# Patient Record
Sex: Male | Born: 1990 | Race: Black or African American | Hispanic: No | Marital: Single | Smoking: Current some day smoker
Health system: Southern US, Community
[De-identification: ages and names within clinical notes are randomized; demographics above are authoritative.]

## PROBLEM LIST (undated history)

## (undated) DIAGNOSIS — K219 Gastro-esophageal reflux disease without esophagitis: Secondary | ICD-10-CM

## (undated) DIAGNOSIS — M545 Low back pain: Secondary | ICD-10-CM

## (undated) DIAGNOSIS — Z7251 High risk heterosexual behavior: Principal | ICD-10-CM

## (undated) DIAGNOSIS — G8929 Other chronic pain: Secondary | ICD-10-CM

## (undated) HISTORY — DX: High risk heterosexual behavior: Z72.51

## (undated) HISTORY — DX: Low back pain: M54.5

## (undated) HISTORY — DX: Other chronic pain: G89.29

---
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
## 2012-07-04 ENCOUNTER — Encounter (HOSPITAL_COMMUNITY): Payer: Self-pay

## 2012-07-04 ENCOUNTER — Emergency Department (HOSPITAL_COMMUNITY)
Admission: EM | Admit: 2012-07-04 | Discharge: 2012-07-04 | Disposition: A | Discharge status: Home / Self Care | Payer: BC Managed Care – PPO | Attending: Emergency Medicine | Admitting: Emergency Medicine

## 2012-07-04 ENCOUNTER — Emergency Department (HOSPITAL_COMMUNITY): Payer: BC Managed Care – PPO

## 2012-07-04 DIAGNOSIS — K219 Gastro-esophageal reflux disease without esophagitis: Secondary | ICD-10-CM | POA: Insufficient documentation

## 2012-07-04 DIAGNOSIS — Y9389 Activity, other specified: Secondary | ICD-10-CM | POA: Insufficient documentation

## 2012-07-04 DIAGNOSIS — S0190XA Unspecified open wound of unspecified part of head, initial encounter: Secondary | ICD-10-CM | POA: Insufficient documentation

## 2012-07-04 DIAGNOSIS — S022XXB Fracture of nasal bones, initial encounter for open fracture: Secondary | ICD-10-CM | POA: Insufficient documentation

## 2012-07-04 DIAGNOSIS — S060XAA Concussion with loss of consciousness status unknown, initial encounter: Secondary | ICD-10-CM | POA: Insufficient documentation

## 2012-07-04 DIAGNOSIS — S060X9A Concussion with loss of consciousness of unspecified duration, initial encounter: Secondary | ICD-10-CM

## 2012-07-04 DIAGNOSIS — S022XXA Fracture of nasal bones, initial encounter for closed fracture: Secondary | ICD-10-CM

## 2012-07-04 DIAGNOSIS — F17200 Nicotine dependence, unspecified, uncomplicated: Secondary | ICD-10-CM | POA: Insufficient documentation

## 2012-07-04 DIAGNOSIS — Y9289 Other specified places as the place of occurrence of the external cause: Secondary | ICD-10-CM | POA: Insufficient documentation

## 2012-07-04 DIAGNOSIS — IMO0002 Reserved for concepts with insufficient information to code with codable children: Secondary | ICD-10-CM

## 2012-07-04 DIAGNOSIS — Z79899 Other long term (current) drug therapy: Secondary | ICD-10-CM | POA: Insufficient documentation

## 2012-07-04 HISTORY — DX: Gastro-esophageal reflux disease without esophagitis: K21.9

## 2012-07-04 MED ORDER — MORPHINE SULFATE 4 MG/ML IJ SOLN
4.0000 mg | Freq: Once | INTRAMUSCULAR | Status: AC
  Administered 2012-07-04: 4 mg via INTRAVENOUS
  Filled 2012-07-04: qty 1

## 2012-07-04 MED ORDER — OXYCODONE-ACETAMINOPHEN 5-325 MG PO TABS: ORAL_TABLET | ORAL | Status: DC

## 2012-07-04 MED ORDER — ONDANSETRON HCL 4 MG/2ML IJ SOLN
4.0000 mg | Freq: Once | INTRAMUSCULAR | Status: AC
  Administered 2012-07-04: 4 mg via INTRAVENOUS
  Filled 2012-07-04: qty 2
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
## 2012-07-04 NOTE — ED Notes (Signed)
Pt ambulatory leaving ED with mom. Pt given d/c instructions and prescriptions. Pt given work note per pt request. Pt and parent have no further questions upon d/c. Pt does not appear to be in any acute distress.
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
## 2012-07-04 NOTE — ED Provider Notes (Signed)
Medical screening examination/treatment/procedure(s) were performed by non-physician practitioner and as supervising physician I was immediately available for consultation/collaboration.  Juliet Rude. Rubin Payor, MD 07/04/12 [phone]

## 2012-07-31 ENCOUNTER — Ambulatory Visit: Payer: BC Managed Care – PPO | Admitting: Family Medicine

## 2012-12-18 ENCOUNTER — Other Ambulatory Visit: Payer: Self-pay

## 2012-12-18 DIAGNOSIS — Z9109 Other allergy status, other than to drugs and biological substances: Secondary | ICD-10-CM

## 2012-12-18 NOTE — Telephone Encounter (Signed)
Refill request for for Zyrtec10 mg. Lotrisone cream, Patanol 0.1mg and Ranitidine 150 mg

## 2012-12-24 MED ORDER — CETIRIZINE HCL 10 MG PO TABS: 10.0000 mg | ORAL_TABLET | Freq: Every day | ORAL | Status: DC | PRN

## 2012-12-24 MED ORDER — CLOTRIMAZOLE-BETAMETHASONE 1-0.05 % EX CREA: TOPICAL_CREAM | Freq: Two times a day (BID) | CUTANEOUS | Status: DC

## 2012-12-24 MED ORDER — OLOPATADINE HCL 0.1 % OP SOLN: 1.0000 [drp] | Freq: Two times a day (BID) | OPHTHALMIC | Status: DC

## 2012-12-24 MED ORDER — RANITIDINE HCL 150 MG PO TABS: 150.0000 mg | ORAL_TABLET | Freq: Every day | ORAL | Status: DC

## 2013-04-17 ENCOUNTER — Ambulatory Visit (INDEPENDENT_AMBULATORY_CARE_PROVIDER_SITE_OTHER): Payer: BC Managed Care – PPO | Admitting: Pediatrics

## 2013-04-17 ENCOUNTER — Telehealth: Payer: Self-pay | Admitting: *Deleted

## 2013-04-17 ENCOUNTER — Encounter: Payer: Self-pay | Admitting: Pediatrics

## 2013-04-17 VITALS — BP 118/70 | HR 80 | Temp 98.0°F | Wt 243.0 lb

## 2013-04-17 DIAGNOSIS — Z7251 High risk heterosexual behavior: Secondary | ICD-10-CM

## 2013-04-17 DIAGNOSIS — M549 Dorsalgia, unspecified: Secondary | ICD-10-CM

## 2013-04-17 DIAGNOSIS — R300 Dysuria: Secondary | ICD-10-CM

## 2013-04-17 DIAGNOSIS — G8929 Other chronic pain: Secondary | ICD-10-CM

## 2013-04-17 DIAGNOSIS — M545 Low back pain, unspecified: Secondary | ICD-10-CM

## 2013-04-17 HISTORY — DX: Other chronic pain: G89.29

## 2013-04-17 HISTORY — DX: Low back pain, unspecified: M54.50

## 2013-04-17 HISTORY — DX: High risk heterosexual behavior: Z72.51

## 2013-04-17 LAB — POCT URINALYSIS DIPSTICK
Bilirubin, UA: NEGATIVE
Blood, UA: NEGATIVE
Glucose, UA: NEGATIVE
Ketones, UA: NEGATIVE
Leukocytes, UA: NEGATIVE
Nitrite, UA: NEGATIVE
Protein, UA: NEGATIVE
Spec Grav, UA: 1.02
Urobilinogen, UA: NEGATIVE
pH, UA: 6.5

## 2013-04-17 MED ORDER — AZITHROMYCIN 1 G PO PACK: 2.0000 | PACK | Freq: Once | ORAL | Status: DC

## 2013-04-17 MED ORDER — PENICILLIN G BENZATHINE 2400000 UNIT/4ML IM SUSP: 1.0000 | Freq: Once | INTRAMUSCULAR | Status: DC

## 2013-04-17 MED ORDER — CEFTRIAXONE SODIUM 1 G IJ SOLR
250.0000 mg | Freq: Once | INTRAMUSCULAR | Status: AC
  Administered 2013-04-17: 250 mg via INTRAMUSCULAR

## 2013-04-17 NOTE — Patient Instructions (Signed)
Sexually Transmitted Disease  Sexually transmitted disease (STD) refers to any infection that is passed from person to person during sexual activity. This may happen by way of saliva, semen, blood, vaginal mucus, or urine. Common STDs include:   Gonorrhea.   Chlamydia.   Syphilis.   HIV/AIDS.   Genital herpes.   Hepatitis B and C.   Trichomonas.   Human papillomavirus (HPV).   Pubic lice.  CAUSES   An STD may be spread by bacteria, virus, or parasite. A person can get an STD by:   Sexual intercourse with an infected person.   Sharing sex toys with an infected person.   Sharing needles with an infected person.   Having intimate contact with the genitals, mouth, or rectal areas of an infected person.  SYMPTOMS   Some people may not have any symptoms, but they can still pass the infection to others. Different STDs have different symptoms. Symptoms include:   Painful or bloody urination.   Pain in the pelvis, abdomen, vagina, anus, throat, or eyes.   Skin rash, itching, irritation, growths, or sores (lesions). These usually occur in the genital or anal area.   Abnormal vaginal discharge.   Penile discharge in men.   Soft, flesh-colored skin growths in the genital or anal area.   Fever.   Pain or bleeding during sexual intercourse.   Swollen glands in the groin area.   Yellow skin and eyes (jaundice). This is seen with hepatitis.  DIAGNOSIS   To make a diagnosis, your caregiver may:   Take a medical history.   Perform a physical exam.   Take a specimen (culture) to be examined.   Examine a sample of discharge under a microscope.   Perform blood tests.   Perform a Pap test, if this applies.   Perform a colposcopy.   Perform a laparoscopy.  TREATMENT    Chlamydia, gonorrhea, trichomonas, and syphilis can be cured with antibiotic medicine.   Genital herpes, hepatitis, and HIV can be treated, but not cured, with prescribed medicines. The medicines will lessen the symptoms.   Genital warts  from HPV can be treated with medicine or by freezing, burning (electrocautery), or surgery. Warts may come back.   HPV is a virus and cannot be cured with medicine or surgery.However, abnormal areas may be followed very closely by your caregiver and may be removed from the cervix, vagina, or vulva through office procedures or surgery.  If your diagnosis is confirmed, your recent sexual partners need treatment. This is true even if they are symptom-free or have a negative culture or evaluation. They should not have sex until their caregiver says it is okay.  HOME CARE INSTRUCTIONS   All sexual partners should be informed, tested, and treated for all STDs.   Take your antibiotics as directed. Finish them even if you start to feel better.   Only take over-the-counter or prescription medicines for pain, discomfort, or fever as directed by your caregiver.   Rest.   Eat a balanced diet and drink enough fluids to keep your urine clear or pale yellow.   Do not have sex until treatment is completed and you have followed up with your caregiver. STDs should be checked after treatment.   Keep all follow-up appointments, Pap tests, and blood tests as directed by your caregiver.   Only use latex condoms and water-soluble lubricants during sexual activity. Do not use petroleum jelly or oils.   Avoid alcohol and illegal drugs.   Get vaccinated   for HPV and hepatitis. If you have not received these vaccines in the past, talk to your caregiver about whether one or both might be right for you.   Avoid risky sex practices that can break the skin.  The only way to avoid getting an STD is to avoid all sexual activity.Latex condoms and dental dams (for oral sex) will help lessen the risk of getting an STD, but will not completely eliminate the risk.  SEEK MEDICAL CARE IF:    You have a fever.   You have any new or worsening symptoms.  Document Released: 11/12/2002 Document Revised: 11/14/2011 Document Reviewed:  11/19/2010  [employer] Patient Information 2014 [employer]
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
## 2013-04-17 NOTE — Telephone Encounter (Signed)
Called pt and informed him that MD was calling in another ABT and that he had to pick it up at pharmacy and bring to office to be administered. Explained to him that it was an injection and to bring to office in morning. Pt understanding and appreciative.

## 2013-04-18 LAB — GC/CHLAMYDIA PROBE AMP, URINE
Chlamydia, Swab/Urine, PCR: NEGATIVE
GC Probe Amp, Urine: NEGATIVE

## 2013-05-09 ENCOUNTER — Ambulatory Visit: Payer: BC Managed Care – PPO | Admitting: Neurology

## 2013-05-31 ENCOUNTER — Ambulatory Visit: Payer: BC Managed Care – PPO | Admitting: Neurology

## 2014-05-24 ENCOUNTER — Other Ambulatory Visit: Payer: Self-pay | Admitting: Pediatrics

## 2015-10-26 ENCOUNTER — Emergency Department (HOSPITAL_COMMUNITY): Payer: No Typology Code available for payment source

## 2015-10-26 ENCOUNTER — Encounter (HOSPITAL_COMMUNITY): Payer: Self-pay

## 2015-10-26 ENCOUNTER — Emergency Department (HOSPITAL_COMMUNITY)
Admission: EM | Admit: 2015-10-26 | Discharge: 2015-10-26 | Disposition: A | Discharge status: Home / Self Care | Payer: No Typology Code available for payment source | Attending: Emergency Medicine | Admitting: Emergency Medicine

## 2015-10-26 DIAGNOSIS — S0001XA Abrasion of scalp, initial encounter: Secondary | ICD-10-CM | POA: Insufficient documentation

## 2015-10-26 DIAGNOSIS — S0990XA Unspecified injury of head, initial encounter: Secondary | ICD-10-CM

## 2015-10-26 DIAGNOSIS — F17210 Nicotine dependence, cigarettes, uncomplicated: Secondary | ICD-10-CM | POA: Diagnosis not present

## 2015-10-26 DIAGNOSIS — S199XXA Unspecified injury of neck, initial encounter: Secondary | ICD-10-CM | POA: Diagnosis not present

## 2015-10-26 DIAGNOSIS — S39012A Strain of muscle, fascia and tendon of lower back, initial encounter: Secondary | ICD-10-CM | POA: Diagnosis not present

## 2015-10-26 DIAGNOSIS — Y9389 Activity, other specified: Secondary | ICD-10-CM | POA: Diagnosis not present

## 2015-10-26 DIAGNOSIS — K219 Gastro-esophageal reflux disease without esophagitis: Secondary | ICD-10-CM | POA: Diagnosis not present

## 2015-10-26 DIAGNOSIS — G8929 Other chronic pain: Secondary | ICD-10-CM | POA: Diagnosis not present

## 2015-10-26 DIAGNOSIS — Y998 Other external cause status: Secondary | ICD-10-CM | POA: Insufficient documentation

## 2015-10-26 DIAGNOSIS — Y92410 Unspecified street and highway as the place of occurrence of the external cause: Secondary | ICD-10-CM | POA: Diagnosis not present

## 2015-10-26 DIAGNOSIS — Z79899 Other long term (current) drug therapy: Secondary | ICD-10-CM | POA: Insufficient documentation

## 2015-10-26 DIAGNOSIS — S3992XA Unspecified injury of lower back, initial encounter: Secondary | ICD-10-CM | POA: Diagnosis present

## 2015-10-26 MED ORDER — TRAMADOL HCL 50 MG PO TABS: 50.0000 mg | ORAL_TABLET | Freq: Four times a day (QID) | ORAL | Status: DC | PRN

## 2015-10-26 MED ORDER — IBUPROFEN 600 MG PO TABS: 600.0000 mg | ORAL_TABLET | Freq: Four times a day (QID) | ORAL | Status: DC | PRN

## 2015-10-26 MED ORDER — IBUPROFEN 800 MG PO TABS
800.0000 mg | ORAL_TABLET | Freq: Once | ORAL | Status: AC
  Administered 2015-10-26: 800 mg via ORAL
  Filled 2015-10-26: qty 1
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
## 2019-03-18 ENCOUNTER — Other Ambulatory Visit: Payer: Self-pay

## 2019-03-18 DIAGNOSIS — Z20822 Contact with and (suspected) exposure to covid-19: Secondary | ICD-10-CM

## 2019-03-22 LAB — NOVEL CORONAVIRUS, NAA: SARS-CoV-2, NAA: NOT DETECTED

## 2019-09-17 ENCOUNTER — Ambulatory Visit: Payer: BLUE CROSS/BLUE SHIELD | Attending: Internal Medicine

## 2019-09-17 ENCOUNTER — Other Ambulatory Visit: Payer: Self-pay

## 2019-09-17 DIAGNOSIS — Z20822 Contact with and (suspected) exposure to covid-19: Secondary | ICD-10-CM

## 2019-09-18 LAB — NOVEL CORONAVIRUS, NAA: SARS-CoV-2, NAA: NOT DETECTED

## 2022-06-13 ENCOUNTER — Encounter (HOSPITAL_COMMUNITY): Payer: Self-pay | Admitting: Emergency Medicine

## 2022-06-13 ENCOUNTER — Emergency Department (HOSPITAL_COMMUNITY)
Admission: EM | Admit: 2022-06-13 | Discharge: 2022-06-13 | Disposition: A | Discharge status: Home / Self Care | Payer: BLUE CROSS/BLUE SHIELD | Attending: Emergency Medicine | Admitting: Emergency Medicine

## 2022-06-13 ENCOUNTER — Other Ambulatory Visit: Payer: Self-pay

## 2022-06-13 DIAGNOSIS — B349 Viral infection, unspecified: Secondary | ICD-10-CM | POA: Insufficient documentation

## 2022-06-13 DIAGNOSIS — Z20822 Contact with and (suspected) exposure to covid-19: Secondary | ICD-10-CM | POA: Insufficient documentation

## 2022-06-13 DIAGNOSIS — J039 Acute tonsillitis, unspecified: Secondary | ICD-10-CM | POA: Insufficient documentation

## 2022-06-13 LAB — RESP PANEL BY RT-PCR (FLU A&B, COVID) ARPGX2
Influenza A by PCR: NEGATIVE
Influenza B by PCR: NEGATIVE
SARS Coronavirus 2 by RT PCR: NEGATIVE

## 2022-06-13 LAB — GROUP A STREP BY PCR: Group A Strep by PCR: NOT DETECTED

## 2022-06-13 MED ORDER — ONDANSETRON 4 MG PO TBDP: 4.0000 mg | ORAL_TABLET | Freq: Three times a day (TID) | ORAL | 0 refills | Status: DC | PRN

## 2022-06-13 NOTE — ED Provider Notes (Signed)
[facility] EMERGENCY DEPARTMENT Provider Note   CSN: [id number] Arrival date & time: 06/13/22  1130     History  Chief Complaint  Patient presents with   Sore Throat    Carlos Dennis is a 31 y.o. male.  Patient presents the emergency department for evaluation of flulike illness ongoing over the past 3 days.  Patient has had subjective fever and chills with associated sore throat.  Also nausea, vomiting, and diarrhea.  Last episode of vomiting was this morning after taking OTC cold and flu medication.  No ear pain, runny nose or cough.  No known sick contacts.  No urinary symptoms.  No abdominal pain.  Patient denies history of illness or chronic medications.       Home Medications Prior to Admission medications   Medication Sig Start Date End Date Taking? Authorizing Provider  azithromycin (ZITHROMAX) 1 G powder Take 2 packets by mouth once. 04/17/13   Laurell Josephs, MD  cetirizine (ZYRTEC) 10 MG tablet Take 1 tablet (10 mg total) by mouth daily as needed. For allergies 12/18/12   Laurell Josephs, MD  clotrimazole-betamethasone (LOTRISONE) cream Apply topically 2 (two) times daily. 12/18/12   Laurell Josephs, MD  ibuprofen (ADVIL,MOTRIN) 600 MG tablet Take 1 tablet (600 mg total) by mouth every 6 (six) hours as needed. 10/26/15   Burgess Amor, PA-C  olopatadine (PATANOL) 0.1 % ophthalmic solution Place 1 drop into both eyes 2 (two) times daily. 12/18/12   Laurell Josephs, MD  oxyCODONE-acetaminophen (PERCOCET/ROXICET) 5-325 MG per tablet 1 to 2 tabs PO q6hrs  PRN for pain 07/04/12   Pisciotta, Joni Reining, PA-C  Penicillin G Benzathine (BICILLIN L-A) 2400000 UNIT/4ML SUSP Inject 1 ampule into the muscle once. 04/18/13   Laurell Josephs, MD  penicillin v potassium (VEETID) 500 MG tablet  01/15/13   [provider]  ranitidine (ZANTAC) 150 MG tablet Take 1 tablet (150 mg total) by mouth daily. For acid reflux 12/18/12   Martyn Ehrich A, MD  traMADol (ULTRAM) 50 MG tablet Take  1 tablet (50 mg total) by mouth every 6 (six) hours as needed. 10/26/15   Burgess Amor, PA-C      Allergies    Patient has no known allergies.    Review of Systems   Review of Systems  Physical Exam Updated Vital Signs BP (!) 145/97 (BP Location: Right Arm)   Pulse 87   Temp 99.3 F (37.4 C) (Oral)   Resp 18   Ht 5\' 10"  (1.778 m)   Wt 115.7 kg   SpO2 97%   BMI 36.59 kg/m   Physical Exam Vitals and nursing note reviewed.  Constitutional:      Appearance: He is well-developed.  HENT:     Head: Normocephalic and atraumatic.     Jaw: No trismus.     Right Ear: Tympanic membrane, ear canal and external ear normal.     Left Ear: Tympanic membrane, ear canal and external ear normal.     Nose: Nose normal. No mucosal edema or rhinorrhea.     Mouth/Throat:     Mouth: Mucous membranes are not dry.     Pharynx: Uvula midline. Oropharyngeal exudate present. No posterior oropharyngeal erythema or uvula swelling.     Tonsils: No tonsillar abscesses. 3+ on the right. 3+ on the left.  Eyes:     General:        Right eye: No discharge.        Left eye: No  discharge.     Conjunctiva/sclera: Conjunctivae normal.  Cardiovascular:     Rate and Rhythm: Normal rate and regular rhythm.     Heart sounds: Normal heart sounds.  Pulmonary:     Effort: Pulmonary effort is normal. No respiratory distress.     Breath sounds: Normal breath sounds. No wheezing or rales.  Abdominal:     Palpations: Abdomen is soft.     Tenderness: There is no abdominal tenderness.  Musculoskeletal:     Cervical back: Normal range of motion and neck supple.  Skin:    General: Skin is warm and dry.  Neurological:     Mental Status: He is alert.     ED Results / Procedures / Treatments   Labs (all labs ordered are listed, but only abnormal results are displayed) Labs Reviewed  RESP PANEL BY RT-PCR (FLU A&B, COVID) ARPGX2  GROUP A STREP BY PCR    EKG None  Radiology No results  found.  Procedures Procedures    Medications Ordered in ED Medications - No data to display  ED Course/ Medical Decision Making/ A&P    Patient seen and examined. History obtained directly from patient. Work-up including labs, imaging, EKG ordered in triage, if performed, were reviewed.    Labs/EKG: Strep and flu/COVID pending.  Imaging: None ordered  Medications/Fluids: Ordered: None ordered  Most recent vital signs reviewed and are as follows: BP (!) 145/97 (BP Location: Right Arm)   Pulse 87   Temp 99.3 F (37.4 C) (Oral)   Resp 18   Ht 5\' 10"  (1.778 m)   Wt 115.7 kg   SpO2 97%   BMI 36.59 kg/m   Initial impression: Flulike illness, tonsillitis with exudate  1:08 PM Reassessment performed. Patient appears stable.  Labs personally reviewed and interpreted including: COVID, flu, strep testing are negative.  Reviewed pertinent lab work and imaging with patient at bedside. Questions answered.   Most current vital signs reviewed and are as follows: BP (!) 145/97 (BP Location: Right Arm)   Pulse 87   Temp 99.3 F (37.4 C) (Oral)   Resp 18   Ht 5\' 10"  (1.778 m)   Wt 115.7 kg   SpO2 97%   BMI 36.59 kg/m   Plan: Discharge to home.   Prescriptions written for: Zofran.  Other home care instructions discussed: Rest, OTC medications for symptom control  ED return instructions discussed: Return with worsening sore throat, persistent vomiting, worsening abdominal pain, or other concerns.  Follow-up instructions discussed: Patient encouraged to follow-up with their PCP in 3-5 days for recheck if not improving.                           Medical Decision Making Risk Prescription drug management.   Patient with symptoms consistent with a viral syndrome.  No focal abdominal pain but has had some vomiting.  Strep, flu, COVID testing negative.  Clinical exudative tonsillitis.  Vitals are stable, no fever. No signs of dehydration. Lung exam normal, low concern for  pneumonia. Supportive therapy indicated with return if symptoms worsen.          Final Clinical Impression(s) / ED Diagnoses Final diagnoses:  Tonsillitis  Viral illness    Rx / DC Orders ED Discharge Orders          Ordered    ondansetron (ZOFRAN-ODT) 4 MG disintegrating tablet  Every 8 hours PRN        06/13/22 1307  Carlisle Cater, PA-C 06/13/22 1309    Fredia Sorrow, MD 06/17/22 [phone]

## 2022-06-13 NOTE — ED Triage Notes (Signed)
Pt presents with fever, chills, and sore throat for over 1 week.

## 2022-06-13 NOTE — ED Notes (Signed)
Pt c/o sore throat x 3 days. Redness at swelling noted to sides of throat. C/o n/d  since yesterday x 2 and vomited once this am. Mm moist. Nad.

## 2022-06-13 NOTE — Discharge Instructions (Signed)
Please read and follow all provided instructions.  Your diagnoses today include:  1. Tonsillitis   2. Viral illness     Tests performed today include: Strep test: was negative for strep throat COVID/flu testing: negative Vital signs. See below for your results today.   Medications prescribed:  Please use over-the-counter NSAID medications (ibuprofen, naproxen) or Tylenol (acetaminophen) as directed on the packaging for pain -- as long as you do not have any reasons avoid these medications. Reasons to avoid NSAID medications include: weak kidneys, a history of bleeding in your stomach or gut, or uncontrolled high blood pressure or previous heart attack. Reasons to avoid Tylenol include: liver problems or ongoing alcohol use. Never take more than 4000mg  or 8 Extra strength Tylenol in a 24 hour period.     Home care instructions:  Please read the educational materials provided and follow any instructions contained in this packet.  Follow-up instructions: Please follow-up with your primary care provider as needed for further evaluation of your symptoms.  Return instructions:  Please return to the Emergency Department if you experience worsening symptoms.  Return if you have worsening problems swallowing, your neck becomes swollen, you cannot swallow your saliva or your voice becomes muffled.  Return with high persistent fever, persistent vomiting, or if you have trouble breathing.  Please return if you have any other emergent concerns.  Additional Information:  Your vital signs today were: BP (!) 145/97 (BP Location: Right Arm)   Pulse 87   Temp 99.3 F (37.4 C) (Oral)   Resp 18   Ht 5\' 10"  (1.778 m)   Wt 115.7 kg   SpO2 97%   BMI 36.59 kg/m  If your blood pressure (BP) was elevated above 135/85 this visit, please have this repeated by your doctor within one month. --------------

## 2024-05-14 ENCOUNTER — Encounter (HOSPITAL_COMMUNITY): Payer: Self-pay

## 2024-05-14 ENCOUNTER — Emergency Department (HOSPITAL_COMMUNITY)
Admission: EM | Admit: 2024-05-14 | Discharge: 2024-05-14 | Disposition: A | Discharge status: Home / Self Care | Attending: Emergency Medicine | Admitting: Emergency Medicine

## 2024-05-14 ENCOUNTER — Emergency Department (HOSPITAL_COMMUNITY)

## 2024-05-14 ENCOUNTER — Other Ambulatory Visit: Payer: Self-pay

## 2024-05-14 DIAGNOSIS — R791 Abnormal coagulation profile: Secondary | ICD-10-CM | POA: Insufficient documentation

## 2024-05-14 DIAGNOSIS — R0789 Other chest pain: Secondary | ICD-10-CM | POA: Diagnosis present

## 2024-05-14 DIAGNOSIS — F17210 Nicotine dependence, cigarettes, uncomplicated: Secondary | ICD-10-CM | POA: Diagnosis not present

## 2024-05-14 DIAGNOSIS — R079 Chest pain, unspecified: Secondary | ICD-10-CM

## 2024-05-14 LAB — COMPREHENSIVE METABOLIC PANEL WITH GFR
ALT: 32 U/L (ref 0–44)
AST: 26 U/L (ref 15–41)
Albumin: 3.9 g/dL (ref 3.5–5.0)
Alkaline Phosphatase: 50 U/L (ref 38–126)
Anion gap: 12 (ref 5–15)
BUN: 9 mg/dL (ref 6–20)
CO2: 24 mmol/L (ref 22–32)
Calcium: 10.5 mg/dL — ABNORMAL HIGH (ref 8.9–10.3)
Chloride: 102 mmol/L (ref 98–111)
Creatinine, Ser: 1.01 mg/dL (ref 0.61–1.24)
GFR, Estimated: >60 mL/min (ref >60)
Glucose, Bld: 106 mg/dL — ABNORMAL HIGH (ref 70–99)
Potassium: 3.7 mmol/L (ref 3.5–5.1)
Sodium: 138 mmol/L (ref 135–145)
Total Bilirubin: 1.5 mg/dL — ABNORMAL HIGH (ref 0.0–1.2)
Total Protein: 7.1 g/dL (ref 6.5–8.1)

## 2024-05-14 LAB — CBC
HCT: 53.8 % — ABNORMAL HIGH (ref 39.0–52.0)
Hemoglobin: 18.6 g/dL — ABNORMAL HIGH (ref 13.0–17.0)
MCH: 31.5 pg (ref 26.0–34.0)
MCHC: 34.6 g/dL (ref 30.0–36.0)
MCV: 91 fL (ref 80.0–100.0)
Platelets: 210 K/uL (ref 150–400)
RBC: 5.91 MIL/uL — ABNORMAL HIGH (ref 4.22–5.81)
RDW: 12.6 % (ref 11.5–15.5)
WBC: 9.7 K/uL (ref 4.0–10.5)
nRBC: 0 % (ref 0.0–0.2)

## 2024-05-14 LAB — D-DIMER, QUANTITATIVE: D-Dimer, Quant: <0.27 ug{FEU}/mL (ref 0.00–0.50)

## 2024-05-14 LAB — TROPONIN I (HIGH SENSITIVITY): Troponin I (High Sensitivity): 2 ng/L (ref <18)

## 2024-05-14 LAB — LIPASE, BLOOD: Lipase: 31 U/L (ref 11–51)

## 2024-05-14 MED ORDER — MELOXICAM 15 MG PO TBDP: 15.0000 mg | ORAL_TABLET | Freq: Every day | ORAL | 0 refills | Status: AC

## 2024-05-14 NOTE — ED Provider Notes (Signed)
 [place] [facility] AT [facility] Provider Note   CSN: [id number] Arrival date & time: 05/14/24  1708     Patient presents with: Chest Pain   Carlos Dennis is a 33 y.o. male.    Chest Pain  The patient is a 33 year old male, he denies taking any chronic daily medications and in fact states that he is otherwise pretty healthy, he does endorse being overweight and smoking cigarettes but has no history of heart disease lung disease liver or kidney disease.  He reports that in the last couple of weeks he has started to have some left-sided chest pain which comes on randomly and intermittently often at nighttime when he is laying down, sometimes sharp and stabbing, never worse with breathing and not associated with fevers chills nausea vomiting or diarrhea.  He has not had any recent long trips or travel surgeries immobilizations trauma or any other risk for pulmonary embolism, he denies any swelling of his legs or rashes.    Prior to Admission medications   Medication Sig Start Date End Date Taking? Authorizing Provider  Meloxicam  15 MG TBDP Take 15 mg by mouth daily for 14 days. 05/14/24 05/28/24 Yes Cleotilde Rogue, MD    Allergies: Patient has no known allergies.    Review of Systems  Cardiovascular:  Positive for chest pain.  All other systems reviewed and are negative.   Updated Vital Signs BP (!) 133/91   Pulse 87   Temp 98.3 F (36.8 C) (Oral)   Resp (!) 24   Ht 1.803 m (5' 11)   Wt 121.1 kg   SpO2 97%   BMI 37.24 kg/m   Physical Exam Vitals and nursing note reviewed.  Constitutional:      General: He is not in acute distress.    Appearance: He is well-developed.  HENT:     Head: Normocephalic and atraumatic.     Mouth/Throat:     Pharynx: No oropharyngeal exudate.  Eyes:     General: No scleral icterus.       Right eye: No discharge.        Left eye: No discharge.     Conjunctiva/sclera: Conjunctivae normal.     Pupils: Pupils are  equal, round, and reactive to light.  Neck:     Thyroid: No thyromegaly.     Vascular: No JVD.  Cardiovascular:     Rate and Rhythm: Normal rate and regular rhythm.     Heart sounds: Normal heart sounds. No murmur heard.    No friction rub. No gallop.  Pulmonary:     Effort: Pulmonary effort is normal. No respiratory distress.     Breath sounds: Normal breath sounds. No wheezing or rales.  Abdominal:     General: Bowel sounds are normal. There is no distension.     Palpations: Abdomen is soft. There is no mass.     Tenderness: There is no abdominal tenderness.  Musculoskeletal:        General: No tenderness. Normal range of motion.     Cervical back: Normal range of motion and neck supple.     Right lower leg: No edema.     Left lower leg: No edema.  Lymphadenopathy:     Cervical: No cervical adenopathy.  Skin:    General: Skin is warm and dry.     Findings: No erythema or rash.  Neurological:     Mental Status: He is alert.     Coordination: Coordination normal.  Psychiatric:        Behavior: Behavior normal.     (all labs ordered are listed, but only abnormal results are displayed) Labs Reviewed  CBC - Abnormal; Notable for the following components:      Result Value   RBC 5.91 (*)    Hemoglobin 18.6 (*)    HCT 53.8 (*)    All other components within normal limits  COMPREHENSIVE METABOLIC PANEL WITH GFR - Abnormal; Notable for the following components:   Glucose, Bld 106 (*)    Calcium 10.5 (*)    Total Bilirubin 1.5 (*)    All other components within normal limits  LIPASE, BLOOD  D-DIMER, QUANTITATIVE  TROPONIN I (HIGH SENSITIVITY)    EKG: EKG Interpretation Date/Time:  Tuesday May 14 2024 17:19:18 EDT Ventricular Rate:  79 PR Interval:  184 QRS Duration:  89 QT Interval:  352 QTC Calculation: 404 R Axis:   68  Text Interpretation: Sinus rhythm Probable left atrial enlargement Borderline T wave abnormalities Baseline wander in lead(s) II aVF  Confirmed by Cleotilde Rogue ([id number]) on 05/14/2024 5:32:55 PM  Radiology: [facility] Chest 2 View Result Date: 05/14/2024 CLINICAL DATA:  Left chest pain for 2 weeks, diaphoresis EXAM: CHEST - 2 VIEW COMPARISON:  03/21/2007 FINDINGS: Frontal and lateral views of the chest demonstrate an unremarkable cardiac silhouette. No acute airspace disease, effusion, or pneumothorax. No acute bony abnormalities. IMPRESSION: 1. No acute intrathoracic process. Electronically Signed   By: Ozell Daring M.D.   On: 05/14/2024 17:50     Procedures   Medications Ordered in the ED - No data to display                                  Medical Decision Making Amount and/or Complexity of Data Reviewed Labs: ordered. Radiology: ordered.  Risk Prescription drug management.    This patient presents to the ED for concern of chest pain, this involves an extensive number of treatment options, and is a complaint that carries with it a high risk of complications and morbidity.  The differential diagnosis includes skill skeletal, anxiety, acid reflux, pleurisy, could be cardiac but does not seem exertional at all and he states he does not have the symptoms with exertion   Co morbidities / Chronic conditions that complicate the patient evaluation  Has likely some baseline hypertension but is currently untreated blood pressure 143/99 at this time   Additional history obtained:  Additional history obtained from EMR External records from outside source obtained and reviewed including medical record including old EKG from over 17 years ago, followed in the outpatient setting for eustachian tube disorder, no recent admissions to the hospital   Lab Tests:  I Ordered, and personally interpreted labs.  The pertinent results include: D-dimer is undetectably low, CBC shows a hemoconcentration but no leukocytosis, troponin is negative   Imaging Studies ordered:  I ordered imaging studies including chest x-ray I independently  visualized and interpreted imaging which showed no acute findings on chest x-ray to suggest cardiac or pulmonary abnormalities I agree with the radiologist interpretation   Cardiac Monitoring: / EKG:  The patient was maintained on a cardiac monitor.  I personally viewed and interpreted the cardiac monitored which showed an underlying rhythm of: Normal sinus rhythm, EKG shows no ischemia   Problem List / ED Course / Critical interventions / Medication management  The patient's presentation is reassuringly not anginal, it is  not consistent with pulmonary embolism and is more likely consistent with some type of pleuritic pain as it certainly gets worse when he lays down. No signs of pulmonary embolism clinically, D-dimer is negative, ACS seems much less likely given EKG symptoms and negative troponin especially since he has had chest pain ongoing last night and into today without any relief I ordered medication including meloxicam  for home I have reviewed the patients home medicines and have made adjustments as needed  Patient is agreeable to the plan  Social Determinants of Health:  Tobacco use   Test / Admission - Considered:  Considered admission but no acute findings, patient low risk  I have discussed with the patient at the bedside the results, and the meaning of these results.  They have had opportunity to ask questions,  expressed their understanding to the need for follow-up with primary care physician    Final diagnoses:  Left-sided chest pain    ED Discharge Orders          Ordered    Meloxicam  15 MG TBDP  Daily        05/14/24 1842               Cleotilde Rogue, MD 05/14/24 1844

## 2024-05-14 NOTE — ED Notes (Signed)
 ED Provider at bedside.

## 2024-05-14 NOTE — ED Triage Notes (Signed)
 Pt arrived via POV c/o left chest pain X 2 weeks. Pt endorses diaphoresis, heart fluttering sensation and intermittent dizziness. Pt also reports 2 weeks of diarrhea.

## 2024-05-14 NOTE — Discharge Instructions (Signed)
 Thankfully all of your test have been normal including testing for blood clots, testing for heart attacks, and your x-ray was normal as well.  I do want you to follow-up with the family doctor, if you do not have a doctor see the list below.  Your blood pressure was slightly elevated tonight but not to the point where you would need blood pressure medication.  This does need to be rechecked by a doctor in the office within a couple of weeks.  If you develop severe worsening chest pain return to the ER but until that time you can take meloxicam  once a day to help with the pain  I have sent this to the pharmacy for you  Thank you for allowing us  to treat you in the emergency department today.  After reviewing your examination and potential testing that was done it appears that you are safe to go home.  I would like for you to follow-up with your doctor within the next several days, have them obtain your records and follow-up with them to review all potential tests and results from your visit.  If you should develop severe or worsening symptoms return to the emergency department immediately  [facility] Primary Care Doctor List    Rollene Pesa, MD. Specialty: [facility] Medicine Contact information: [address]  [place] [address]  [phone]   Glendia Fielding, MD. Specialty: [facility] Medicine Contact information: [address]  [place] [address]  [phone]   Benita Outhouse, MD Specialty: Internal Medicine Contact information: [address] [place] [address]  [phone]   Darlyn Hurst, MD. Specialty: Internal Medicine Contact information: [address]  [place] [address]  [phone]    [facility] (Dr. Luke) Specialty: Family Medicine Contact information: [address]  [place] [address]  [phone]   Gaither Langton, MD. Specialty: Internal Medicine Contact information: [address]  [address]  [place] [address]   [phone]   [facility]  [address] [place], [place] [address] [phone]  Services [facility] offers a variety of basic health services.  People needing more complex services will be directed to a physician online. Using these virtual visits, doctors can evaluate and prescribe medicine and treatments. There will be no medication on-site, though [facility] will help patients fill their prescriptions at little to no cost.   [facility] PRIMARY CARE [address] [place]  [address]  [phone]     [facility] [address] [place]  [address]  [phone]   For More information please go to: [email or web address]
